# Patient Record
Sex: Male | Born: 1985 | Hispanic: Yes | Marital: Single
Health system: Southern US, Community
[De-identification: ages and names within clinical notes are randomized; demographics above are authoritative.]

---

## 2021-08-24 ENCOUNTER — Other Ambulatory Visit: Payer: Self-pay

## 2021-08-24 ENCOUNTER — Encounter (HOSPITAL_COMMUNITY): Payer: Self-pay

## 2021-08-24 ENCOUNTER — Emergency Department (HOSPITAL_COMMUNITY): Payer: Self-pay

## 2021-08-24 ENCOUNTER — Emergency Department (HOSPITAL_COMMUNITY)
Admission: EM | Admit: 2021-08-24 | Discharge: 2021-08-24 | Disposition: A | Discharge status: Home / Self Care | Payer: Self-pay | Attending: Emergency Medicine | Admitting: Emergency Medicine

## 2021-08-24 DIAGNOSIS — Y990 Civilian activity done for income or pay: Secondary | ICD-10-CM | POA: Insufficient documentation

## 2021-08-24 DIAGNOSIS — S61012A Laceration without foreign body of left thumb without damage to nail, initial encounter: Secondary | ICD-10-CM | POA: Insufficient documentation

## 2021-08-24 DIAGNOSIS — W231XXA Caught, crushed, jammed, or pinched between stationary objects, initial encounter: Secondary | ICD-10-CM | POA: Insufficient documentation

## 2021-08-24 MED ORDER — CEPHALEXIN 500 MG PO CAPS: 500.0000 mg | ORAL_CAPSULE | Freq: Four times a day (QID) | ORAL | 0 refills | Status: AC

## 2021-08-24 MED ORDER — LIDOCAINE HCL 2 % IJ SOLN
10.0000 mL | Freq: Once | INTRAMUSCULAR | Status: AC
Start: 2021-08-24 — End: 2021-08-24
  Administered 2021-08-24: 200 mg
  Filled 2021-08-24: qty 20

## 2021-08-24 NOTE — ED Provider Notes (Signed)
[facility] DEPT Provider Note   CSN: [id number] Arrival date & time: 08/24/21  1426     History  Chief Complaint  Patient presents with   Finger Injury    laceration    Ynes Woltering is a 36 y.o. male who presents the emergency department for a left thumb laceration occurring about an hour prior to ER arrival.  Patient was at work when he said it was crushed by a plastic box.  He denies any other trauma.  Spanish interpreter used during interview.  The history is provided by the patient. A language interpreter was used.      Home Medications Prior to Admission medications   Medication Sig Start Date End Date Taking? Authorizing Provider  cephALEXin (KEFLEX) 500 MG capsule Take 1 capsule (500 mg total) by mouth 4 (four) times daily. 08/24/21  Yes Modupe Shampine T, PA-C      Allergies    Patient has no allergy information on record.    Review of Systems   Review of Systems  Skin:  Positive for wound.       Left thumb laceration  All other systems reviewed and are negative.  Physical Exam Updated Vital Signs BP (!) 156/93 (BP Location: Right Arm)    Pulse 84    Temp 98.5 F (36.9 C) (Oral)    Resp 18    SpO2 99%  Physical Exam Vitals and nursing note reviewed.  Constitutional:      Appearance: Normal appearance.  HENT:     Head: Normocephalic and atraumatic.  Eyes:     Conjunctiva/sclera: Conjunctivae normal.  Pulmonary:     Effort: Pulmonary effort is normal. No respiratory distress.  Musculoskeletal:     Comments: Normal ROM of all digits of the left hand. Good capillary refill.   Skin:    General: Skin is warm and dry.     Comments: Approximately 2 cm deep laceration of the left lateral thumb terminating just proximally to the nail hyponychium oozing blood. Normal sensation.  Neurological:     Mental Status: He is alert.  Psychiatric:        Mood and Affect: Mood normal.        Behavior: Behavior normal.        ED  Results / Procedures / Treatments   Labs (all labs ordered are listed, but only abnormal results are displayed) Labs Reviewed - No data to display  EKG None  Radiology DG Finger Thumb Right  Result Date: 08/24/2021 CLINICAL DATA:  Trauma EXAM: RIGHT THUMB 2+V COMPARISON:  None. FINDINGS: No fracture or dislocation is seen. There is soft tissue deformity and possible laceration near the tip of the thumb. There are no opaque foreign bodies. IMPRESSION: No fracture or dislocation is seen in the right thumb. There are no radiopaque foreign bodies. Electronically Signed   By: Elmer Picker M.D.   On: 08/24/2021 16:23    Procedures .Nerve Block  Date/Time: 08/24/2021 8:28 PM Performed by: Kateri Plummer, PA-C Authorized by: Kateri Plummer, PA-C   Consent:    Consent obtained:  Verbal   Consent given by:  Patient   Risks discussed:  Pain and swelling Universal protocol:    Patient identity confirmed:  Verbally with patient Indications:    Indications:  Procedural anesthesia Location:    Body area:  Upper extremity   Upper extremity nerve blocked: Digital left thumb. Pre-procedure details:    Skin preparation:  Alcohol Skin anesthesia:  Skin anesthesia method:  Local infiltration   Local anesthetic:  Lidocaine 2% w/o epi Procedure details:    Block needle gauge:  25 G   Anesthetic injected:  Lidocaine 2% w/o epi   Injection procedure:  Anatomic landmarks palpated Post-procedure details:    Outcome:  Anesthesia achieved   Procedure completion:  Tolerated well, no immediate complications .Marland KitchenLaceration Repair  Date/Time: 08/24/2021 8:30 PM Performed by: Kateri Plummer, PA-C Authorized by: Kateri Plummer, PA-C   Consent:    Consent obtained:  Verbal   Consent given by:  Patient   Risks discussed:  Infection, need for additional repair, pain, poor cosmetic result and poor wound healing   Alternatives discussed:  No treatment and delayed  treatment Universal protocol:    Procedure explained and questions answered to patient or proxy's satisfaction: yes     Relevant documents present and verified: yes     Test results available: yes     Imaging studies available: yes     Required blood products, implants, devices, and special equipment available: yes     Site/side marked: yes     Immediately prior to procedure, a time out was called: yes     Patient identity confirmed:  Verbally with patient Anesthesia:    Anesthesia method:  Nerve block   Block needle gauge:  25 G   Block anesthetic:  Lidocaine 2% w/o epi   Block injection procedure:  Anatomic landmarks palpated   Block outcome:  Anesthesia achieved Laceration details:    Location:  Finger   Finger location:  L thumb   Length (cm):  2 Pre-procedure details:    Preparation:  Patient was prepped and draped in usual sterile fashion Exploration:    Limited defect created (wound extended): no     Hemostasis achieved with:  Tourniquet   Imaging outcome: foreign body not noted   Treatment:    Area cleansed with:  Saline and Shur-Clens   Amount of cleaning:  Standard Skin repair:    Repair method:  Sutures   Suture size:  5-0   Wound skin closure material used: 5 Vicryl, 1 Chromic gut.   Suture technique:  Simple interrupted   Number of sutures:  6 Approximation:    Approximation:  Close Repair type:    Repair type:  Intermediate Post-procedure details:    Dressing:  Non-adherent dressing   Procedure completion:  Tolerated well, no immediate complications    Medications Ordered in ED Medications  lidocaine (XYLOCAINE) 2 % (with pres) injection 200 mg (200 mg Infiltration Given by Other 08/24/21 1618)    ED Course/ Medical Decision Making/ A&P                           Medical Decision Making Amount and/or Complexity of Data Reviewed Radiology: ordered.  Risk Prescription drug management.   Is otherwise healthy 36 year old male who presents the  emergency department complaining of a left thumb laceration occurring about an hour prior to ER arrival.  Patient was at work when his hand was crushed by a plastic box.  On my exam patient has an approximately 2 cm deep laceration to the lateral aspect of his left thumb that terminates just proximal to the nail hyponychium.  I consulted Dr. Fredna Dow with hand surgery, who looked at the pictures in the chart, and recommended that I attempt suture repair.  He offered to the patient repairing it in the OR, but patient does  not live in the area and would likely not have anyone to take him home.  The wound was cleaned, and repaired with sutures, with 1 suture through the nail.  Anesthesia was achieved with digital block.  Patient overall tolerated the block and the laceration repair well, with no immediate complications.  Patient up-to-date on tetanus.  We will discharge patient to home with prescription for antibiotics.  He is to follow-up with Dr. Fredna Dow in clinic this upcoming week.  Discussed with the patient's reasons to return to the emergency department, and he is agreeable to the plan.  Final Clinical Impression(s) / ED Diagnoses Final diagnoses:  Thumb laceration, left, initial encounter    Rx / DC Orders ED Discharge Orders          Ordered    cephALEXin (KEFLEX) 500 MG capsule  4 times daily        08/24/21 1844           Portions of this report may have been transcribed using voice recognition software. Every effort was made to ensure accuracy; however, inadvertent computerized transcription errors may be present.    Tian Davison T, PA-C 08/24/21 2032    Lorelle Gibbs, DO 08/25/21 1513

## 2021-08-24 NOTE — Discharge Instructions (Addendum)
Lo vieron en el departamento de emergencias por una laceracin en la mano.  Le dar antibiticos durante los prximos 5 das. Necesitas tomar esto 4 veces al da. Puede tomar ibuprofeno o tylenol para el dolor.  Dr. Merlyn Lot es el mdico de la [facility]. Debe llamarlo para programar una cita de seguimiento. Adjunto su nmero de telfono.

## 2021-08-24 NOTE — ED Notes (Signed)
Cleansed thumb in basin with  warm water. Laceration tray at bedside.

## 2021-08-24 NOTE — ED Triage Notes (Signed)
Large laceration to thumb on right hand. Pt reports something plastic and heavy landed on it. Thumb is bleeding.   A/Ox4 Ambulatory in triage   8/10 pain

## 2023-03-21 IMAGING — CR DG FINGER THUMB 2+V*R*
3 series · 3 of 3 positions shown · non-contrast
Comparison: None.

CLINICAL DATA: Trauma

EXAM:
RIGHT THUMB 2+V

[x finger lat right (1 of 3)]
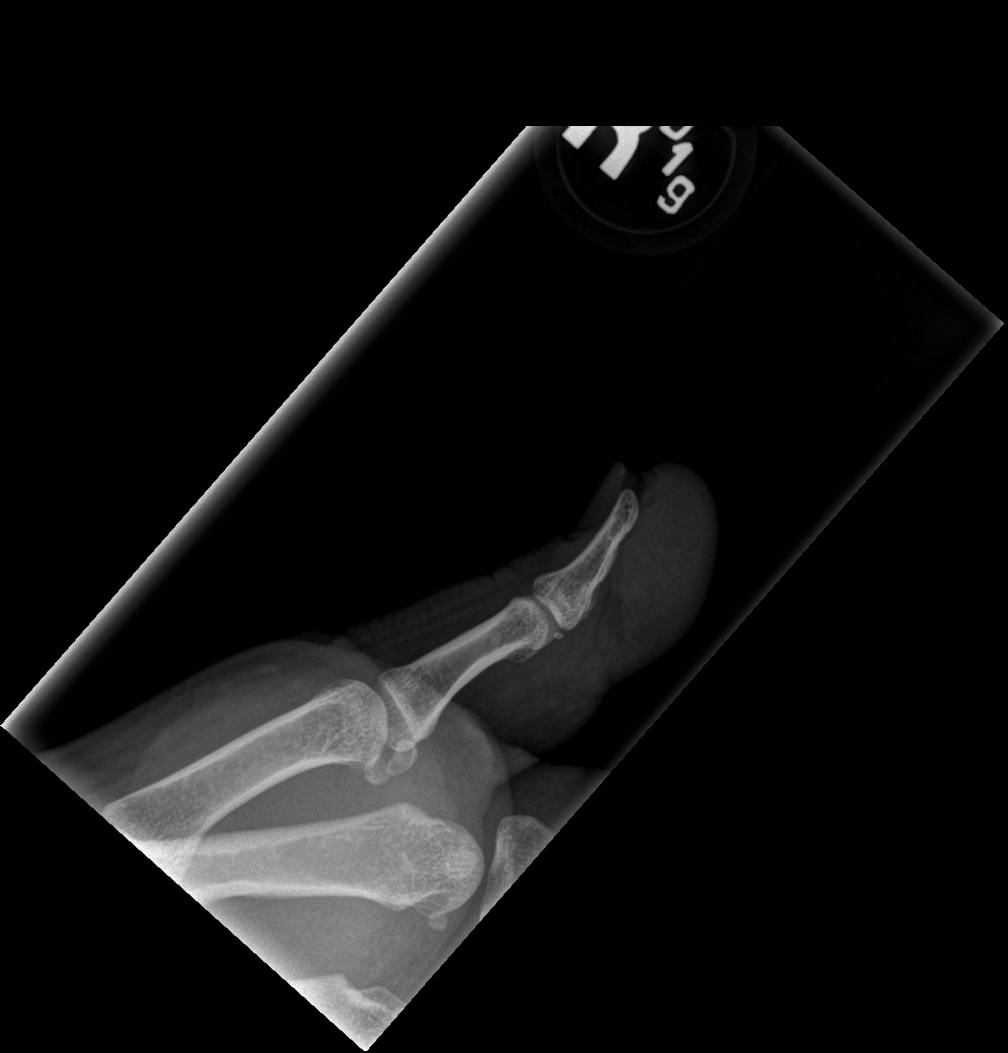

[x finger lat right (2 of 3)]
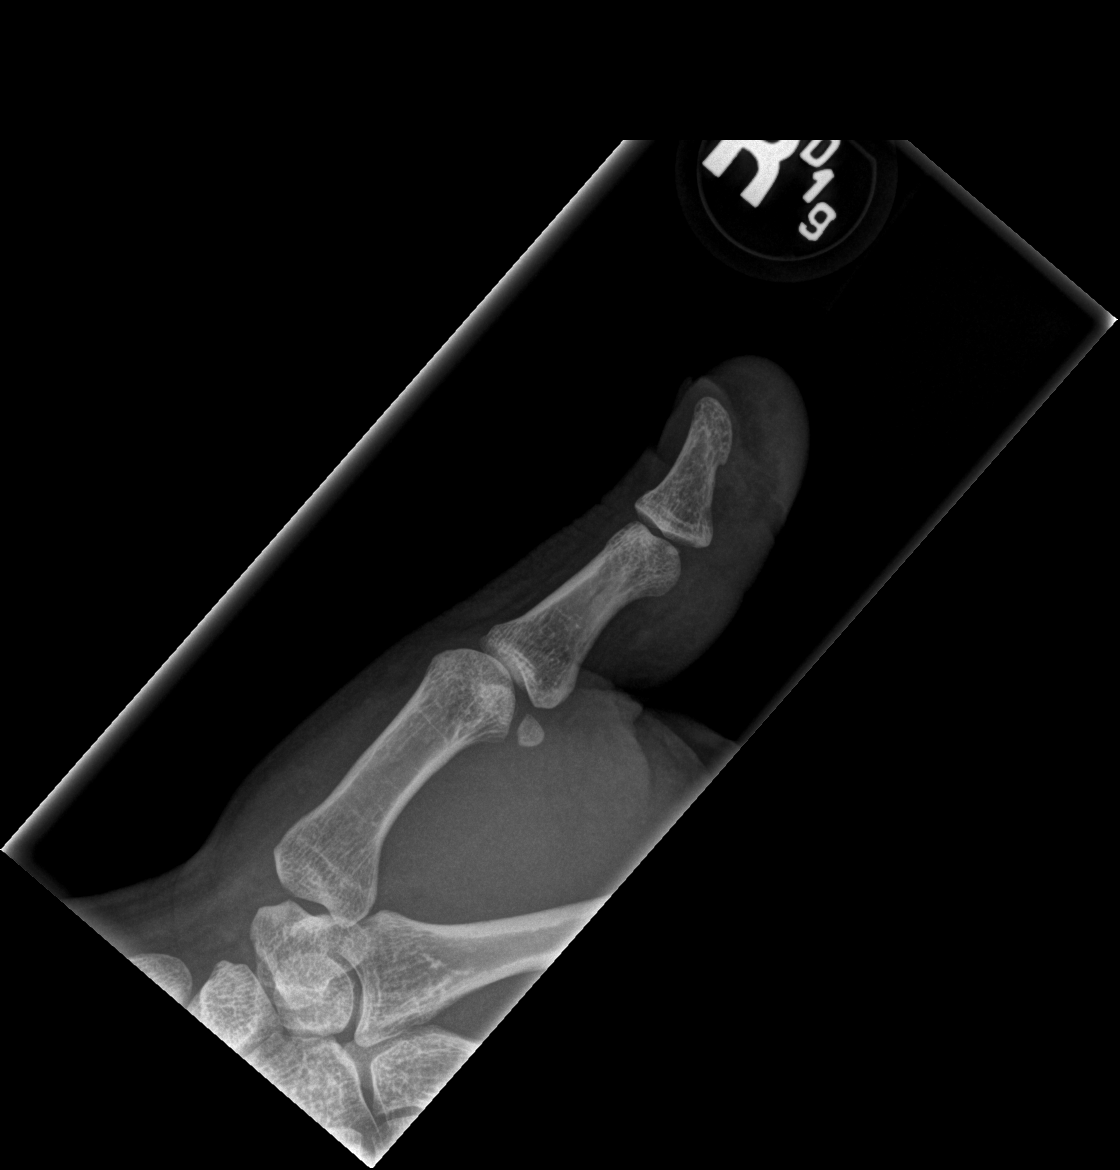

[x finger lat right (3 of 3)]
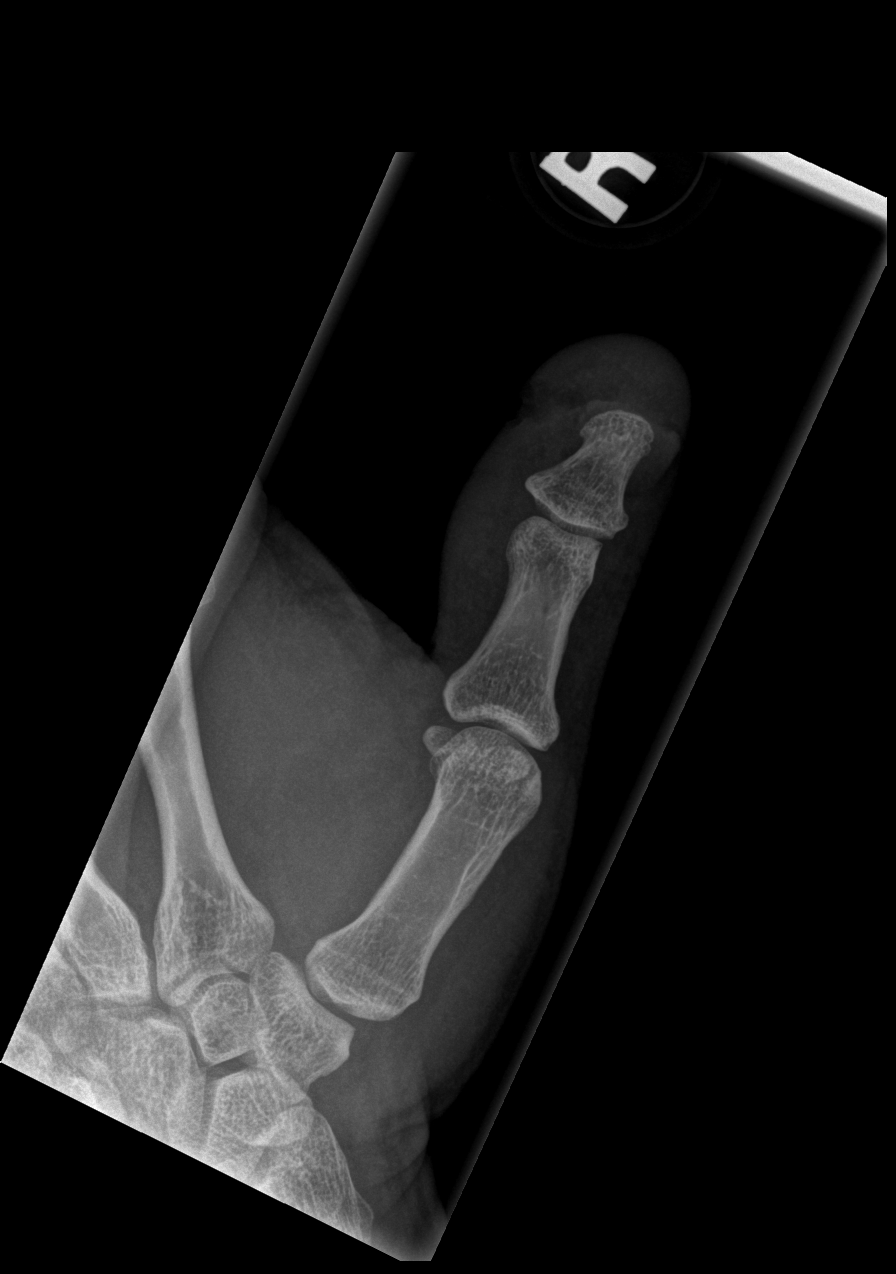

[3 of 3 positions shown; findings below may reference images not displayed]

FINDINGS: No fracture or dislocation is seen. There is soft tissue deformity
and possible laceration near the tip of the thumb. There are no
opaque foreign bodies.
IMPRESSION: No fracture or dislocation is seen in the right thumb. There are no
radiopaque foreign bodies.
